# Patient Record
Sex: Male | Born: 1940 | Race: White | Hispanic: No
Health system: Southern US, Community
[De-identification: ages and names within clinical notes are randomized; demographics above are authoritative.]

---

## 2010-11-07 DIAGNOSIS — E119 Type 2 diabetes mellitus without complications: Secondary | ICD-10-CM

## 2010-11-07 DIAGNOSIS — F329 Major depressive disorder, single episode, unspecified: Secondary | ICD-10-CM

## 2010-11-07 DIAGNOSIS — F3289 Other specified depressive episodes: Secondary | ICD-10-CM

## 2010-11-07 DIAGNOSIS — S72009A Fracture of unspecified part of neck of unspecified femur, initial encounter for closed fracture: Secondary | ICD-10-CM

## 2010-11-26 ENCOUNTER — Telehealth: Payer: Self-pay | Admitting: *Deleted

## 2010-11-26 DIAGNOSIS — F05 Delirium due to known physiological condition: Secondary | ICD-10-CM

## 2010-11-26 NOTE — Telephone Encounter (Signed)
Patient's daughter called and stated that she thinks Dr. Alphonsus Sias will be seeing her dad today at [facility].  She would like Dr. Alphonsus Sias to call her if possible because she would like to be there when Dr. Alphonsus Sias sees her dad today.  Please advise.

## 2010-11-26 NOTE — Telephone Encounter (Signed)
I am going there now I called to let her know

## 2010-12-16 DIAGNOSIS — L02419 Cutaneous abscess of limb, unspecified: Secondary | ICD-10-CM

## 2010-12-16 DIAGNOSIS — L03119 Cellulitis of unspecified part of limb: Secondary | ICD-10-CM

## 2011-01-14 ENCOUNTER — Ambulatory Visit: Payer: Self-pay | Admitting: Internal Medicine

## 2013-08-31 ENCOUNTER — Ambulatory Visit: Payer: Self-pay | Admitting: Internal Medicine

## 2013-09-15 ENCOUNTER — Inpatient Hospital Stay: Payer: Self-pay | Admitting: Internal Medicine

## 2013-09-15 LAB — BASIC METABOLIC PANEL
Anion Gap: 8 (ref 7–16)
BUN: 82 mg/dL — ABNORMAL HIGH (ref 7–18)
CALCIUM: 9.9 mg/dL (ref 8.5–10.1)
Chloride: 111 mmol/L — ABNORMAL HIGH (ref 98–107)
Co2: 31 mmol/L (ref 21–32)
Creatinine: 1.84 mg/dL — ABNORMAL HIGH (ref 0.60–1.30)
EGFR (African American): 42 — ABNORMAL LOW
GFR CALC NON AF AMER: 36 — AB
Glucose: 156 mg/dL — ABNORMAL HIGH (ref 65–99)
Osmolality: 326 (ref 275–301)
POTASSIUM: 3.5 mmol/L (ref 3.5–5.1)
Sodium: 150 mmol/L — ABNORMAL HIGH (ref 136–145)

## 2013-09-15 LAB — URINALYSIS, COMPLETE
Bilirubin,UR: NEGATIVE
Glucose,UR: NEGATIVE mg/dL (ref 0–75)
Hyaline Cast: 20
Ketone: NEGATIVE
Leukocyte Esterase: NEGATIVE
NITRITE: NEGATIVE
Ph: 5 (ref 4.5–8.0)
Protein: NEGATIVE
RBC,UR: 76 /HPF (ref 0–5)
SPECIFIC GRAVITY: 1.017 (ref 1.003–1.030)
SQUAMOUS EPITHELIAL: NONE SEEN
WBC UR: <1 /HPF (ref 0–5)

## 2013-09-15 LAB — CBC
HCT: 54 % — ABNORMAL HIGH (ref 40.0–52.0)
HGB: 17.6 g/dL (ref 13.0–18.0)
MCH: 30.1 pg (ref 26.0–34.0)
MCHC: 32.5 g/dL (ref 32.0–36.0)
MCV: 92 fL (ref 80–100)
Platelet: 254 10*3/uL (ref 150–440)
RBC: 5.85 10*6/uL (ref 4.40–5.90)
RDW: 14.9 % — AB (ref 11.5–14.5)
WBC: 21.4 10*3/uL — ABNORMAL HIGH (ref 3.8–10.6)

## 2013-09-15 LAB — TROPONIN I: Troponin-I: <0.02 ng/mL

## 2013-09-16 LAB — CBC WITH DIFFERENTIAL/PLATELET
Basophil #: 0 10*3/uL (ref 0.0–0.1)
Basophil %: 0.1 %
Eosinophil #: 0 10*3/uL (ref 0.0–0.7)
Eosinophil %: 0.2 %
HCT: 43.4 % (ref 40.0–52.0)
HGB: 14.3 g/dL (ref 13.0–18.0)
LYMPHS ABS: 1.3 10*3/uL (ref 1.0–3.6)
Lymphocyte %: 7.9 %
MCH: 30.4 pg (ref 26.0–34.0)
MCHC: 32.9 g/dL (ref 32.0–36.0)
MCV: 92 fL (ref 80–100)
MONO ABS: 1.1 x10 3/mm — AB (ref 0.2–1.0)
Monocyte %: 6.6 %
NEUTROS ABS: 14.3 10*3/uL — AB (ref 1.4–6.5)
NEUTROS PCT: 85.2 %
PLATELETS: 166 10*3/uL (ref 150–440)
RBC: 4.7 10*6/uL (ref 4.40–5.90)
RDW: 14.5 % (ref 11.5–14.5)
WBC: 16.8 10*3/uL — AB (ref 3.8–10.6)

## 2013-09-16 LAB — BASIC METABOLIC PANEL
Anion Gap: 4 — ABNORMAL LOW (ref 7–16)
BUN: 69 mg/dL — ABNORMAL HIGH (ref 7–18)
CHLORIDE: 121 mmol/L — AB (ref 98–107)
CO2: 29 mmol/L (ref 21–32)
Calcium, Total: 7.6 mg/dL — ABNORMAL LOW (ref 8.5–10.1)
Creatinine: 1.64 mg/dL — ABNORMAL HIGH (ref 0.60–1.30)
EGFR (African American): 48 — ABNORMAL LOW
EGFR (Non-African Amer.): 41 — ABNORMAL LOW
GLUCOSE: 119 mg/dL — AB (ref 65–99)
Osmolality: 327 (ref 275–301)
Potassium: 4.8 mmol/L (ref 3.5–5.1)
Sodium: 154 mmol/L — ABNORMAL HIGH (ref 136–145)

## 2013-09-17 LAB — BASIC METABOLIC PANEL
ANION GAP: 8 (ref 7–16)
BUN: 44 mg/dL — AB (ref 7–18)
CALCIUM: 8.1 mg/dL — AB (ref 8.5–10.1)
CHLORIDE: 110 mmol/L — AB (ref 98–107)
CO2: 30 mmol/L (ref 21–32)
Creatinine: 1.82 mg/dL — ABNORMAL HIGH (ref 0.60–1.30)
EGFR (African American): 42 — ABNORMAL LOW
GFR CALC NON AF AMER: 36 — AB
Glucose: 184 mg/dL — ABNORMAL HIGH (ref 65–99)
Osmolality: 310 (ref 275–301)
POTASSIUM: 3.3 mmol/L — AB (ref 3.5–5.1)
Sodium: 148 mmol/L — ABNORMAL HIGH (ref 136–145)

## 2013-09-17 LAB — URINE CULTURE

## 2013-09-20 LAB — CULTURE, BLOOD (SINGLE)

## 2013-09-30 ENCOUNTER — Ambulatory Visit: Payer: Self-pay | Admitting: Internal Medicine

## 2013-09-30 DEATH — deceased

## 2014-09-23 NOTE — Consult Note (Signed)
Brief Consult Note: Diagnosis: mediastinal emphysema and right pneumothorax.   Patient was seen by consultant.   Comments: Small right pneumothorax with mediastinal emphysema and negative barium swallow.  Likely pulmonary parenchymal process with resultant pneumothorax and mediastinal air.    Would repeat CXRay later today to evaluate for size of pneumothorax.  Electronic Signatures: Jasmine Decemberaks, Jacqueline Delapena E (MD)  (Signed 17-Apr-15 10:29)  Authored: Brief Consult Note   Last Updated: 17-Apr-15 10:29 by Jasmine Decemberaks, Tasman Zapata E (MD)

## 2014-09-23 NOTE — Consult Note (Signed)
PATIENT NAME:  Bryan Weeks, Bryan Weeks MR#:  [id number] DATE OF BIRTH:  23-Apr-1941  DATE OF CONSULTATION:  09/15/2013  REFERRING PHYSICIAN:   CONSULTING PHYSICIAN:  Gedeon Brandow A. Egbert GaribaldiBird, MD  HISTORY OF PRESENT ILLNESS: I was asked to see this patient for evaluation of pneumomediastinum.  This is a 74 year old white male, resident of rehab at [facility] acute hospitalization at [facility] [place] several weeks ago with urinary tract infection, urinary retention and severe constipation. The patient has developed problems with difficulty swallowing and poor p.o. intake and has been treated with a dysphagia diet. He is brought in by his family due to some chest pain and constipation. Workup in the Emergency Room is suggestive of pneumomediastinum seen on a chest x-ray. The patient had a chest CT which I personally reviewed shows extensive pneumomediastinum. There is a loculated right pleural effusion, small right pneumothorax and a small loculated left pneumothorax. There is no mediastinal shift. White count was markedly elevated. Urinalysis was significant for 75 RBCs per high-power field, negative leukocyte esterase, negative nitrite, negative protein, less than 1 white cell per high-power field. Following phone consultation with the Emergency Room physician, Dr. Sharma CovertNorman, Gastroview swallow was obtained during which I was present to review with the radiologist, and there is no evidence of a leak. The patient had significant problems swallowing even the Gastroview. Currently, he is being admitted to the [facility] medical service.   ALLERGIES: PHENERGAN.   MEDICATIONS: Well documented in the chart.   PAST MEDICAL HISTORY: Significant dysphagia, weight loss, urinary tract infection recently, constipation, type 1 diabetes, significant cachexia, hypertension, COPD.    PHYSICAL EXAMINATION:  GENERAL: This is a very debilitated, weak, white male with his family present.  VITAL  SIGNS: Temperature is 98, pulse 102. Respirations were 30; however, upon my arrival were approximately 18 per minute. Blood pressure is 116/82. He is 5 feet 6. BMI of 17.  LUNGS: Clear.  HEART: Regular rate and rhythm. I could appreciate no rub or murmur.  ABDOMEN: Scaphoid, soft and nontender.  EXTREMITIES: Warm and well perfused.  NEUROLOGIC AND PSYCHIATRIC: Limited.   RESULTS: As described above.   IMPRESSION: Pneumomediastinum, pulmonary origin. Gastroview swallow was unremarkable for esophageal perforation. At present, there is no indication for a chest tube insertion. The patient does not wish to be placed on a ventilator. I would recommend treating him for underlying pneumonia, COPD exacerbation. We will follow along with you. I will ask our thoracic specialist to see in the am.  ____________________________ Redge GainerMark A. Egbert GaribaldiBird, MD mab:gb D: 09/15/2013 23:22:02 ET T: 09/16/2013 00:53:25 ET JOB#: [id number]  cc: Loraine LericheMark A. Egbert GaribaldiBird, MD, <Dictator> Sallee Hogrefe A Evanthia Maund MD ELECTRONICALLY SIGNED 09/16/2013 7:12

## 2014-09-23 NOTE — Discharge Summary (Signed)
PATIENT NAME:  Bryan Weeks, KREUSER MR#:  [id number] DATE OF BIRTH:  1940-10-11  DATE OF ADMISSION:  09/15/2013 DATE OF DISCHARGE:  09/17/2013  PRIMARY CARE PHYSICIAN: Leo Grosser, MD  DISPOSITION: To hospice home.  FINAL DIAGNOSES: 1.  Pneumomediastinum.  2.  Severe sepsis with aspiration pneumonia.  3.  Chronic dysphagia.  4.  Acute renal failure.  5.  Hypernatremia.  6.  Severe malnutrition.   CODE STATUS: The patient is a DO NOT RESUSCITATE.  DISCHARGE INSTRUCTIONS: Diet as tolerated, puree. Activity as tolerated. May crush or use liquid when appropriate. Oxygen 3 liters continuous. Can leave in catheter if needed.   MEDICATIONS: Zofran 4 mg every 8 hours as needed for nausea/vomiting, Ventolin HFA 2 puffs every 4 hours as needed for shortness of breath or wheezing, Roxanol 20 mg/mL 0.25 mL orally every hour as needed for shortness of breath or pain.   HOSPITAL COURSE: The patient was admitted September 15, 2013, with failure to thrive. The patient has been declining for the past year. The patient was admitted with sepsis from pneumonia, likely aspiration, and was started on vancomycin and Zosyn. The patient was found to have a pneumomediastinum. Gastrografin study showed no esophageal leak. The patient has chronic dysphagia, debility, failure to thrive.   Laboratory and radiological data during the hospital course included blood cultures that were negative. Glucose 156, BUN 82, creatinine 1.84, sodium 150, potassium 3.5, chloride 111, CO2 of 31. Troponin negative. White blood cell count 21.4, hemoglobin and hematocrit 17.6 and 54.0, platelet count of 254. Urine culture: No growth. Urinalysis: 3+ blood. Initial chest x-ray: Extensive chronic changes both lungs; atelectasis, pneumonia lung bases; atypical appearance of the periphery of the right lung; pneumomediastinum. CT scan of the head: No acute intracranial abnormality. CT scan of the chest showed very large amount of pneumomediastinum,  which extends around the right inner chest wall and into the neck. Also showed bilateral nephrolithiasis. Consolidation in the right upper lobe and lower lobe, loculated right pleural effusion. ABG showed a pH of 7.39, pCO2 of 49, pO2 of 83, O2 saturation 97.8. Barium swallow: No evidence of esophageal perforation. EKG: Sinus rhythm; PVCs; nonspecific ST-T wave changes, especially inferiorly. Creatinine upon discharge 1.82. Chest x-ray showed stable chest, persistent mediastinum that dissects into the right hemithorax.   HOSPITAL COURSE PER PROBLEM LIST:  1.  For the patient's severe sepsis, likely aspiration pneumonia, the patient was on Zosyn and vancomycin while in the hospital. Since the patient is transferred to the hospice home, no further antibiotics needed.  2.  Pneumomediastinum. Chest x-ray: Stable large pneumomediastinum. The patient was seen in consultation by Dr. Thelma Barge, cardiothoracic surgery. Family wanted conservative care. No intervention for this. Overall prognosis poor.  3.  Acute renal failure. The patient was given IV fluids in the hospital. No improvement in creatinine.  4.  Severe hypernatremia. Sodium upon discharge 148. Will unlikely be able to keep up with his nutritional needs.  5.  Severe malnutrition and chronic dysphagia, likely unable to keep up with nutritional needs.   The patient was made a DO NOT RESUSCITATE and transferred to the hospice home. The patient was seen in consultation by Laurette Schimke on the 17th, who helped out with decision-making process to hospice home.   TIME SPENT ON DISCHARGE: 35 minutes.   ____________________________ Herschell Dimes. Renae Gloss, MD rjw:jcm D: 09/17/2013 15:11:02 ET T: 09/17/2013 15:59:40 ET JOB#: [id number]  cc: Herschell Dimes. Renae Gloss, MD, <Dictator> Leo Grosser, MD Salley Scarlet MD ELECTRONICALLY  SIGNED 09/18/2013 14:17

## 2014-09-23 NOTE — Consult Note (Signed)
PATIENT NAME:  Bryan Weeks, Bryan Weeks MR#:  [id number] DATE OF BIRTH:  [birth year]  DATE OF CONSULTATION:  09/16/2013  REFERRING PHYSICIAN:   CONSULTING PHYSICIAN:  Federica Allport E. Thelma Bargeaks, MD  REASON FOR CONSULTATION: Mediastinal emphysema.   HISTORY OF PRESENT ILLNESS: I have personally seen and examined Bryan Weeks. I have discussed his care with Dr. Natale LayMark Bird. I have also discussed his care with his health care power of attorney, his daughter, and the patient's wife.   Bryan Weeks is a 74 year old gentleman who has been fairly independent up until several weeks ago when he was admitted to a rehab facility after he was admitted to an outside hospital with constipation. He was doing reasonably well in the rehab facility when he first went there, but over the last several weeks he has declined dramatically. The family states that he is no longer able to speak and his voice has been very soft and difficult to understand. In addition, he is unable to walk and is essentially bedbound. In addition he has been unable to eat, liquids or solids, and had routine laboratory work done yesterday morning which revealed an elevated white blood cell count. According to the family, this is the reason why the patient came to our Emergency Department, for evaluation of that. Once he was here he was found to have a very small right-sided pneumothorax on a chest x-ray. This led to a CT scan and ultimately a barium swallow. The barium swallow was of poor quality secondary to the patient's inability to swallow large boluses of material and was negative for esophageal perforation.   PHYSICAL EXAMINATION: GENERAL: Presently, the patient is resting comfortably in bed. He is very difficult to understand. He does appear to understand questions, but when he speaks it is very soft and hard to interpret.  LUNGS: Show crackles at on both sides, more on the right than on the left.  HEART: Regular.  ABDOMEN: Scaphoid. There is no tenderness.    ASSESSMENT AND PLAN: I believe that this patient most likely has a parenchymal lung process, perhaps pneumonia, which has caused his pneumothorax and mediastinal emphysema. I do not believe he has a perforated esophagus. I would repeat his chest x-ray later today to evaluate the size of his pneumothorax. I did discuss his care with his health care power of attorney who is his daughter. She states that he does not want to be aggressive and does not want to be intubated. I would continue his intravenous hydration and antibiotics. I would not recommend any additional evaluation at this time.   Thank you very much for allowing me to participate in his care.   ____________________________ Sheppard Plumberimothy E. Thelma Bargeaks, MD teo:sb D: 09/16/2013 10:34:04 ET T: 09/16/2013 10:43:59 ET JOB#: [id number]  cc: Marcial Pacasimothy E. Thelma Bargeaks, MD, <Dictator> Jasmine DecemberIMOTHY E Lesly Joslyn MD ELECTRONICALLY SIGNED 09/30/2013 15:57

## 2014-09-23 NOTE — Consult Note (Signed)
Brief Consult Note: Diagnosis: pneumomediastinum, pulmonary in origin.   Patient was seen by consultant.   Consult note dictated.   Discussed with Attending MD.   Comments: personally reviewed gastroview swallow with radiologist, no evidence of esophageal leak. overall very debilitated patient. Would consider palliative care evaluation.  Electronic Signatures: Natale LayBird, Simora Dingee (MD)  (Signed 16-Apr-15 23:18)  Authored: Brief Consult Note   Last Updated: 16-Apr-15 23:18 by Natale LayBird, Jewel Venditto (MD)

## 2014-09-23 NOTE — H&P (Signed)
PATIENT NAME:  Bryan Weeks, Bryan Weeks MR#:  [id number] DATE OF BIRTH:  09/29/1940  DATE OF ADMISSION:  09/15/2013  PRIMARY CARE PHYSICIAN: Dr. Lorie Phenix.   REFERRING PHYSICIAN: Dr. Ella Bodo.   CHIEF COMPLAINT: Failure to thrive.   HISTORY OF PRESENT ILLNESS: Bryan Weeks is a 74 year old male with history of hypertension, diabetes mellitus, previous history of TIAs. Has been declining for the last 1 year. The patient has been somewhat dysphonic. For the last 2 months, there is significant worsening of the function. In the last 2 days, the patient had significantly decreased p.o. intake. About 10 days back, the patient had a swallow evaluation done. Recommended thickened liquids. The patient has not been eating and drinking for the last 1 week. Has lost significant body weight. Denies having any fever. Workup in the Emergency Department with CT chest showed a large pneumomediastinum. Concerning this, surgery was consulted. Obtained esophagogram. Did not show any obvious leakage of the esophagus; however, the patient has a large mediastinum thought to be from the lungs. Discussed extensively with the patient's family concerning about large pneumomediastinum who felt the patient could decompensate and stated that if the patient declines, will not do further resuscitation. The patient also received vancomycin and Zosyn.   PAST MEDICAL HISTORY:  1. Hypertension.  2. Diabetes mellitus, type 2.  3. TIA.  4. Bronchitis.   ALLERGIES: PHENERGAN.   HOME MEDICATIONS:  1. Zofran 4 mg every 8 hours as needed.  2. Ventolin 90 mcg 2 puffs every 4 hours as needed.  3. Spiriva 18 mcg once a day.  4. Remeron 15 mg once a day.  5. Prozac 20 mg once a day.  6. Mevacor 40 mg daily.  7. Metformin 500 mg 2 times a day.  8. Megestrol 40 mg once a day.  9. Meclizine 1.25 mg every 8 hours.  10. Loratadine 10 mg 1 tablet once a day.  11. Lasix 20 mg once a day.   SOCIAL HISTORY: Previous history of smoking. No  history of alcohol or drug use. Currently living in a nursing home.   FAMILY HISTORY: Could not be obtained from the patient. Noncontributory.   REVIEW OF SYSTEMS:  CONSTITUTIONAL: Severe generalized weakness, significant loss of weight.  EYES: No change in vision.  ENT: No change in hearing.  GASTROINTESTINAL: No nausea, vomiting, abdominal pain.  GENITOURINARY: No dysuria or hematuria.  HEMATOLOGIC: No easy bruising or bleeding.  SKIN: No rash or lesions.  MUSCULOSKELETAL: No joint pains and aches.  NEUROLOGIC:.    PHYSICAL EXAMINATION:  GENERAL: This is a thin-built male lying down in the bed in mild distress.  VITAL SIGNS: Temperature 98, pulse 102, blood pressure , respiratory rate of 16, oxygen saturation is 99% on 2 liters of oxygen.  HEENT: Head normocephalic, atraumatic. There is no scleral icterus. Conjunctivae normal. Pupils equal and react to light. Extraocular movements are intact. Mucous membranes moist. No pharyngeal erythema.  NECK: Supple. No lymphadenopathy. No JVD. No carotid bruit.  CHEST: Has no focal tenderness. Somewhat coarse breath sounds bilaterally.   HEART: S1, S2 regular. No murmurs are heard.  ABDOMEN: Bowel sounds present. Soft, nontender, nondistended.  no hepatosplenomegaly. Extremities- no pedal edema. pulses 2+. Skin- no rash or leisions Musculoskeletal- could not examine and patietn is not cooperative. Neuro- Follows some simple commands. Could not examine motor and sensoty deficits. no apparent cranial nerve abnormalities.   ASSESSMENT AND PLAN:  1. Sepsis from pneumonia: Follow up with blood cultures . Continue with vancomycin and  zosyn. 2. Pneumonia health care associated- cont with vanc and zosyn. f/u with blood cultures drawn from ER. 3. Pneumomediastinum: Seen by surgery and gastrograffin study done showed no esopahgeal leak. Highly concern about broncho pulmonary leak. Very critical. family understands the serious nature of this condition. if  decompensates will keep comfortable. 4. Dysphagea Swallow evaluation done at the nursing home and recommended thickened liquids. Considering patient's infection swallow function seems to be further worse. Keep NPO for now until patietn follows commnds. 5. Debility: patient was undergoing physcal therapy until 2 days back. Worsened with pneumomediastinu and sepsis from pneumonia. If patient improves will involve physical therapy. 6. Failure to thrive: per family has been worsening for the last 1 year. 7. Pleural effusion right sided  get CT lung toevaluate  locualted vs free flowing pleural effusion. If patient does not improve will get thorocentesis.  8. Diabetes mellitus, type 2: Continue with home regimen.  9. Keep the patient on deep vein thrombosis prophylaxis with Lovenox.  10>Hypertension: Will hold the blood pressure medications for now.    TIME SPENT: 45 minutes.   GOALS OF CARE: Had extensive discussions with the patient and the patient's daughter who are agreeable if the patient does not improve or if the patient's condition continues to deteriorate, will keep the patient on palliative care.   ____________________________ Bryan GriffinsPadmaja Genita Nilsson, MD pv:gb D: 09/16/2013 00:40:01 ET T: 09/16/2013 01:51:38 ET JOB#: [id number]  cc: Bryan GriffinsPadmaja Margaret Cockerill, MD, <Dictator> Bryan LavPADMAJA Malyssa Maris MD ELECTRONICALLY SIGNED 09/16/2013 21:08

## 2014-09-23 NOTE — Consult Note (Signed)
   Comments   I met with pt's daughter, who is his Media planner. She says she recognizes that patient is likely approaching end of life. She made a decision yesterday to admit for conservative medical mgt and not to transfer or admit to an ICU where he might use pressors. She still tells me that her intent is for conservative care. She does not seem to want any surgical intervention or invasive procedures. We talked about pt returning to SNF, but he was not able to participate well with rehab, and she does not think this would be appropriate considering his decline. We also talked about the option for transfer to the Hospice Home. This would be her preference. Will ask Ginny Ward, hospice liaison to meet with family today. Daughter is ok with repeat CXR today and tomorrow but would then desire pt transferred to the [facility] when deemed medically appropriate. Should he precipitously decline, she says she would agree with comfort care. All questions answered.  get hospice liaison to meet with familytransfer to the [facility] when medically prudentcare if patient were to decline.   Electronic Signatures: Borders, Kirt Boys (NP)  (Signed 17-Apr-15 15:22)  Authored: Palliative Care Phifer, Izora Gala (MD)  (Signed 17-Apr-15 17:26)  Authored: Palliative Care   Last Updated: 17-Apr-15 17:26 by Phifer, Izora Gala (MD)

## 2014-12-19 IMAGING — CT CT CHEST W/O CM
2 of 3 series · 16 of 46 positions shown, 18 images · non-contrast
Comparison: None.

CLINICAL DATA: Elevated white blood cell count.

EXAM:
CT CHEST WITHOUT CONTRAST
TECHNIQUE: Multidetector CT imaging of the chest was performed following the
standard protocol without IV contrast..

[Series 2: routine chest wo · axial · 0.67mm/px · z∈[-454,-194]mm · 13 of 60 slices shown, 15 images]
[im 4/60  soft-tissue]
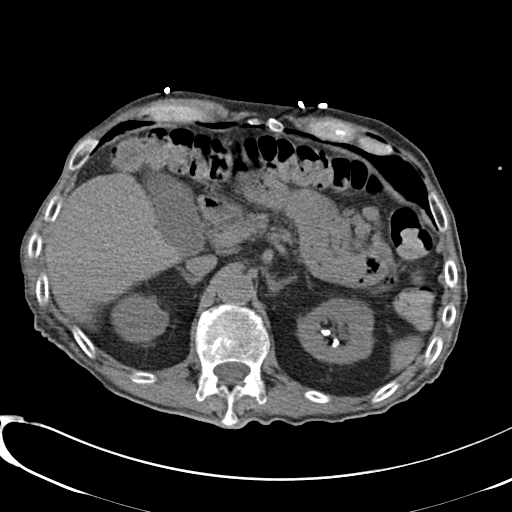
[im 4/60  bone]
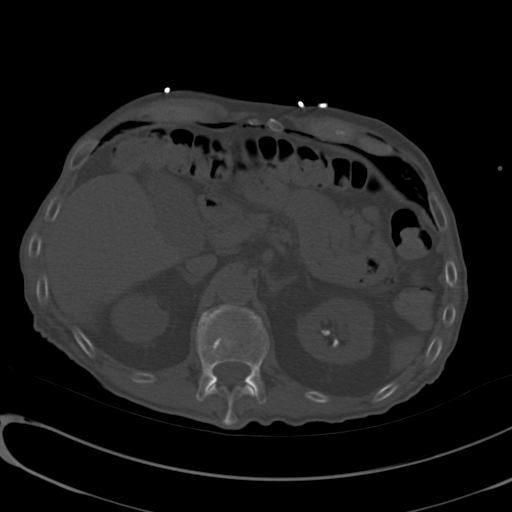
[im 8/60  soft-tissue]
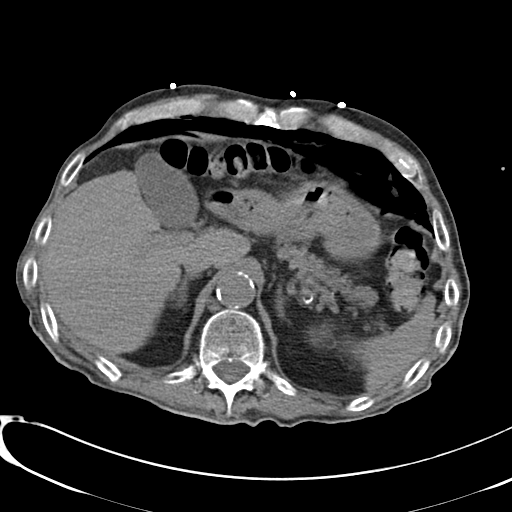
[im 12/60  soft-tissue]
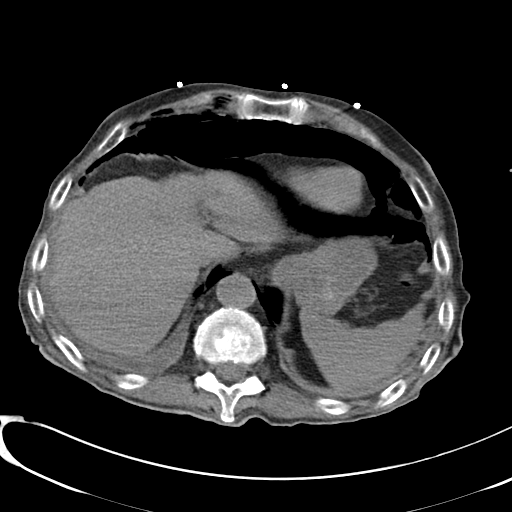
[im 18/60  soft-tissue]
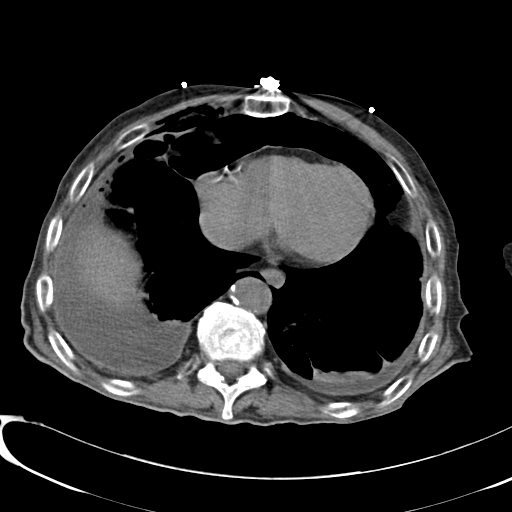
[im 21/60  soft-tissue]
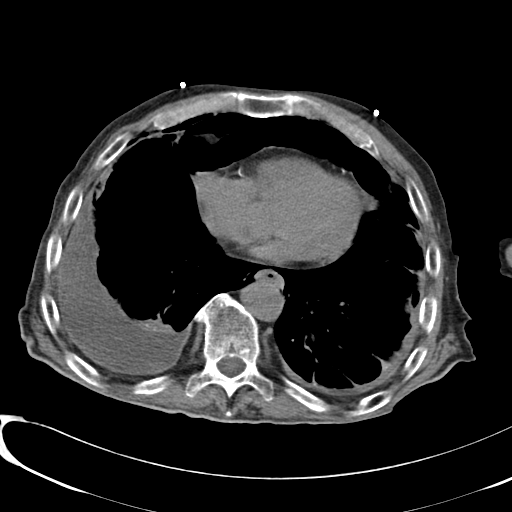
[im 25/60  soft-tissue]
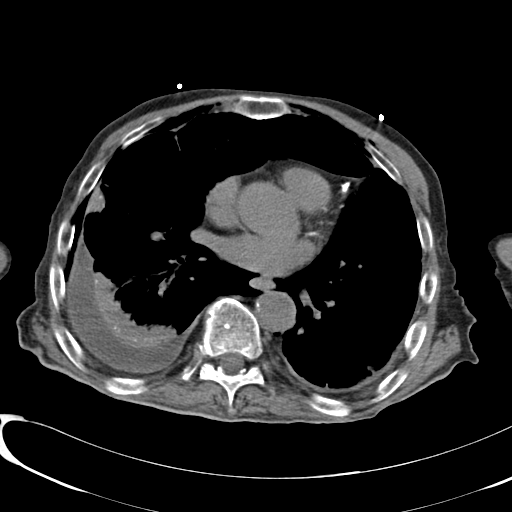
[im 31/60  soft-tissue]
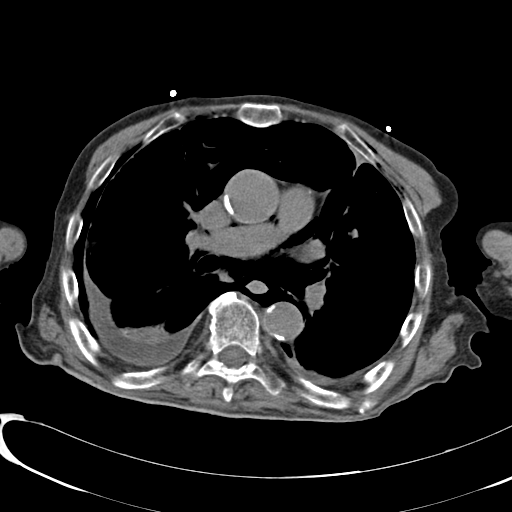
[im 35/60  soft-tissue]
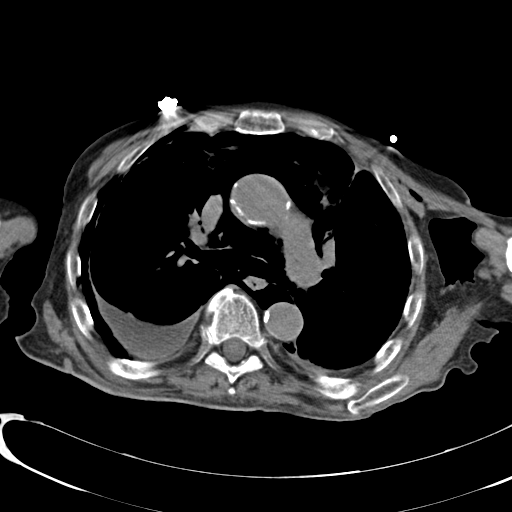
[im 39/60  soft-tissue]
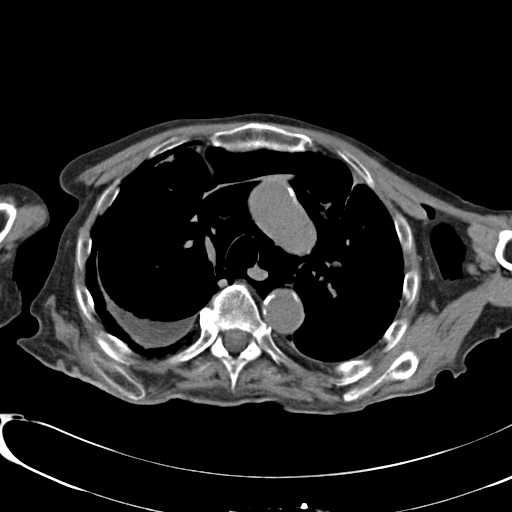
[im 39/60  bone]
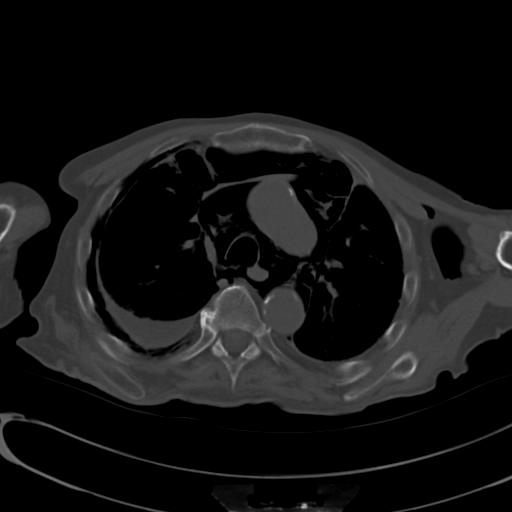
[im 42/60  soft-tissue]
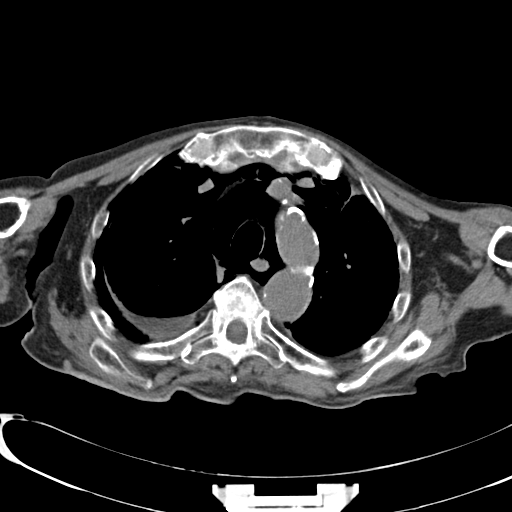
[im 48/60  soft-tissue]
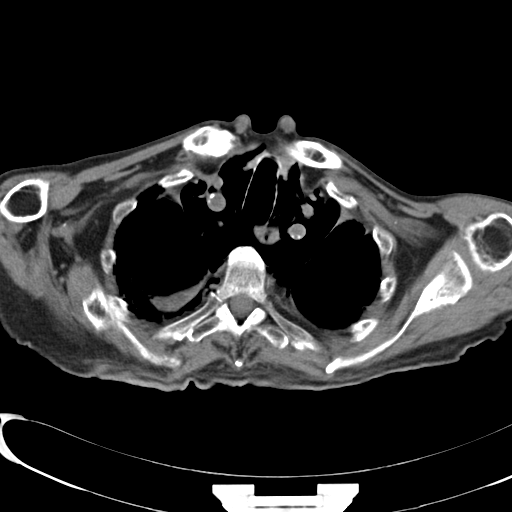
[im 52/60  soft-tissue]
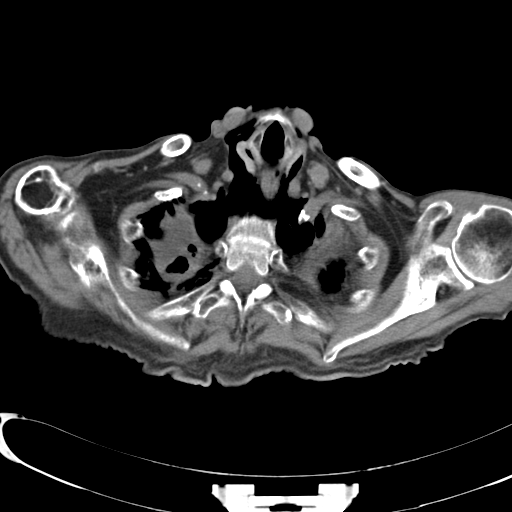
[im 56/60  soft-tissue]
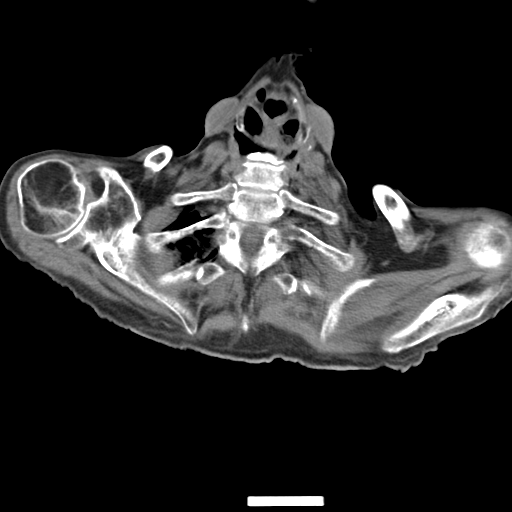

[Series 5: routine chest wo cor · coronal · 0.62mm/px · 3 of 110 slices shown]
[im 37/110  soft-tissue]
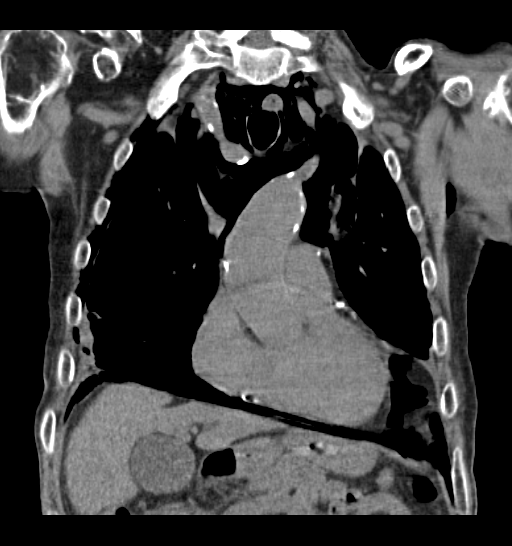
[im 49/110  soft-tissue]
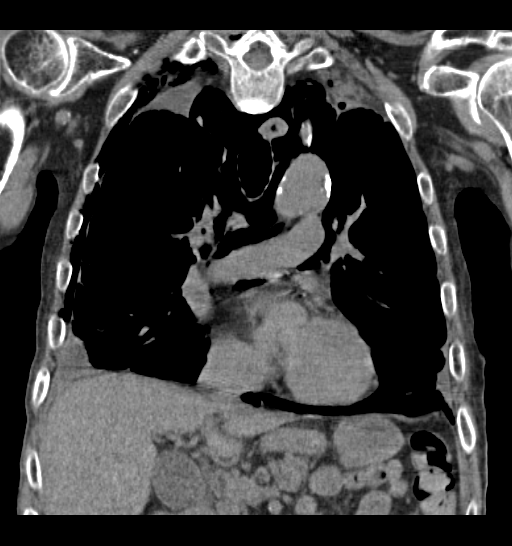
[im 61/110  soft-tissue]
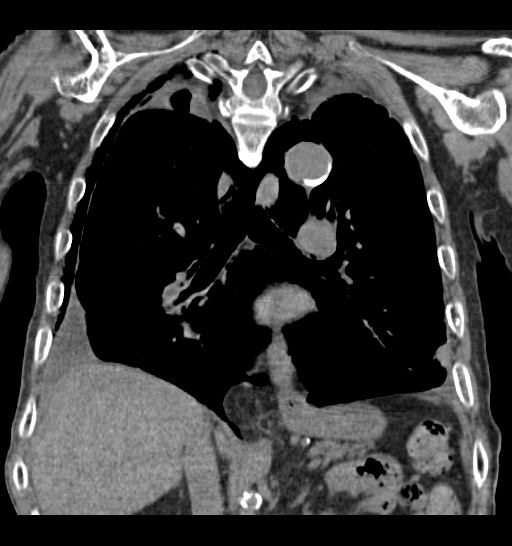

[16 of 46 positions shown; findings below may reference images not displayed]

FINDINGS: There is an enormous amount of mediastinal emphysema. It extends to
the level of the diaphragmatic hiatus as well as into the neck. And
also extends inferiorly causing mass effect upon the right and left
lower lobes. Gas dissects into the right chest wall surrounding the
right pleural space. A right pleural effusion is noted. There are
elements suggesting loculation. There are also some gas bubbles
within the pleural fluid superiorly in the right hemi thorax
compatible with small loculated pneumothoraces. A similar tiny
loculated pneumothorax at the left apex is also suspected. See image
10. A small left pleural effusion is noted with elements of
loculation.

There is no obvious fluid collection associated with the esophagus
to suggest esophageal rupture and enteral extravasation. There is no
obvious discontinuity of the bronchi or lobar airways. No obvious
mediastinal hemorrhage. Atherosclerotic calcifications are noted.
Coronary artery calcifications are present.

There is confluent parenchymal opacity in the peripheral right upper
lobe on image 36 measuring 1.8 cm. It is also present in the
dependent right lower lobe measuring 6.8 x 2.0 cm. Similar less
prominent changes at the left lung base. This consolidation is
associated with calcification.

No acute rib fracture. There is deformity of the medial right
clavicle likely related to posttraumatic changes. Thoracic spine is
intact.

Bilateral nephrolithiasis. Small 8 mm splenic artery aneurysm is
suspected. Partially imaged hypodensity in the right kidney.
IMPRESSION: There is a very large amount of pneumomediastinum which extends
around the right inner chest wall, and into the neck. The etiology
of this may be related to pulmonary parenchymal barotrauma, airway
injury, esophageal injury, or pneumothorax. Tiny loculated
pneumothoraces are seen bilaterally.

There is consolidation in the right upper and lower lobes
peripherally with calcification. This is associated with a loculated
right pleural effusion. This may simply represent round atelectasis.
Similar changes to a lesser degree are present at the left lung
base. Comparison with prior studies if available would be helpful.
If none are available, 3 month followup is recommended to ensure
stability.

Bilateral nephrolithiasis.

## 2014-12-20 IMAGING — CR DG CHEST 1V PORT
1 series · 1 of 1 positions shown · non-contrast
Comparison: Portable exam 7272 hr compared to 09/15/2013

CLINICAL DATA: Pleural effusion

EXAM:
PORTABLE CHEST - 1 VIEW

[ap]
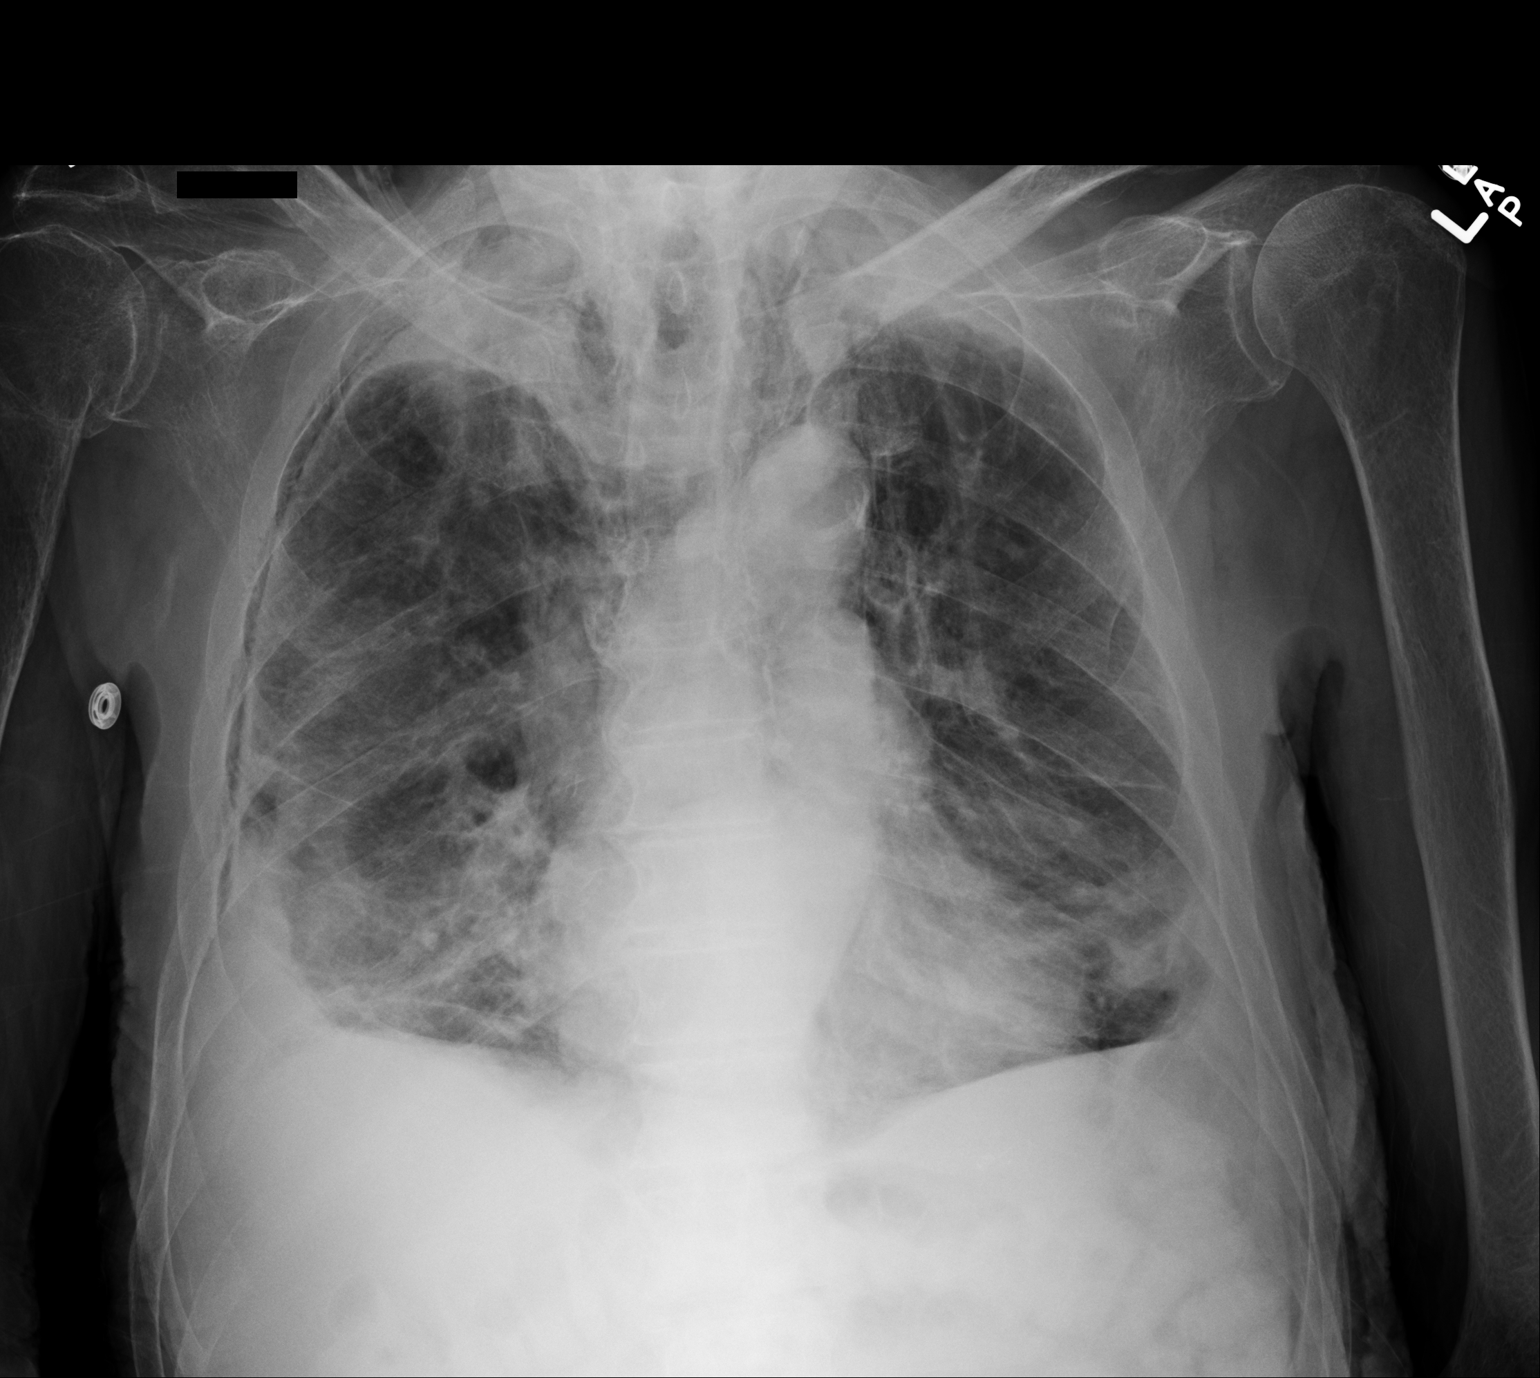

[1 of 1 positions shown; findings below may reference images not displayed]

FINDINGS: Enlargement of cardiac silhouette.

Calcified tortuous aorta.

Persistent pneumomediastinum.

Blunting of costophrenic angles by small pleural effusions.

Persistent lucency along the right lateral chest wall, by prior CT
exam felt to represent dissection of pneumomediastinum subpleural in
the right chest.

Scattered areas of atelectasis and underlying emphysematous changes.

Bones demineralized.
IMPRESSION: Persistent pneumomediastinum dissecting subpleural into the right
hemi thorax as discussed on prior CT.

Persistent bibasilar effusions, atelectasis, an underlying COPD.

Enlargement of cardiac silhouette.
